# Patient Record
Sex: Male | Born: 1975 | Race: White | Hispanic: No | Marital: Married | Smoking: Never smoker
Health system: Southern US, Community
[De-identification: ages and names within clinical notes are randomized; demographics above are authoritative.]

## PROBLEM LIST (undated history)

## (undated) DIAGNOSIS — M5126 Other intervertebral disc displacement, lumbar region: Secondary | ICD-10-CM

## (undated) DIAGNOSIS — E7800 Pure hypercholesterolemia, unspecified: Secondary | ICD-10-CM

## (undated) HISTORY — DX: Pure hypercholesterolemia, unspecified: E78.00

---

## 2008-05-22 ENCOUNTER — Encounter: Admission: RE | Admit: 2008-05-22 | Discharge: 2008-05-22 | Payer: Self-pay | Admitting: Orthopedic Surgery

## 2017-04-29 ENCOUNTER — Emergency Department (HOSPITAL_BASED_OUTPATIENT_CLINIC_OR_DEPARTMENT_OTHER): Payer: BLUE CROSS/BLUE SHIELD

## 2017-04-29 ENCOUNTER — Encounter (HOSPITAL_BASED_OUTPATIENT_CLINIC_OR_DEPARTMENT_OTHER): Payer: Self-pay | Admitting: Emergency Medicine

## 2017-04-29 ENCOUNTER — Emergency Department (HOSPITAL_BASED_OUTPATIENT_CLINIC_OR_DEPARTMENT_OTHER)
Admission: EM | Admit: 2017-04-29 | Discharge: 2017-04-29 | Disposition: A | Discharge status: Home / Self Care | Payer: BLUE CROSS/BLUE SHIELD | Attending: Emergency Medicine | Admitting: Emergency Medicine

## 2017-04-29 ENCOUNTER — Other Ambulatory Visit: Payer: Self-pay

## 2017-04-29 DIAGNOSIS — N201 Calculus of ureter: Secondary | ICD-10-CM | POA: Diagnosis not present

## 2017-04-29 DIAGNOSIS — N200 Calculus of kidney: Secondary | ICD-10-CM

## 2017-04-29 DIAGNOSIS — N23 Unspecified renal colic: Secondary | ICD-10-CM

## 2017-04-29 DIAGNOSIS — R1031 Right lower quadrant pain: Secondary | ICD-10-CM | POA: Diagnosis present

## 2017-04-29 DIAGNOSIS — Z79899 Other long term (current) drug therapy: Secondary | ICD-10-CM | POA: Insufficient documentation

## 2017-04-29 HISTORY — DX: Other intervertebral disc displacement, lumbar region: M51.26

## 2017-04-29 LAB — COMPREHENSIVE METABOLIC PANEL
ALT: 18 U/L (ref 17–63)
ANION GAP: 9 (ref 5–15)
AST: 19 U/L (ref 15–41)
Albumin: 4.6 g/dL (ref 3.5–5.0)
Alkaline Phosphatase: 56 U/L (ref 38–126)
BUN: 24 mg/dL — AB (ref 6–20)
CHLORIDE: 101 mmol/L (ref 101–111)
CO2: 30 mmol/L (ref 22–32)
Calcium: 9.7 mg/dL (ref 8.9–10.3)
Creatinine, Ser: 1.04 mg/dL (ref 0.61–1.24)
Glucose, Bld: 126 mg/dL — ABNORMAL HIGH (ref 65–99)
POTASSIUM: 4.5 mmol/L (ref 3.5–5.1)
Sodium: 140 mmol/L (ref 135–145)
Total Bilirubin: 0.3 mg/dL (ref 0.3–1.2)
Total Protein: 7.7 g/dL (ref 6.5–8.1)

## 2017-04-29 LAB — LIPASE, BLOOD: LIPASE: 35 U/L (ref 11–51)

## 2017-04-29 LAB — URINALYSIS, ROUTINE W REFLEX MICROSCOPIC
Bilirubin Urine: NEGATIVE
GLUCOSE, UA: NEGATIVE mg/dL
Ketones, ur: NEGATIVE mg/dL
LEUKOCYTES UA: NEGATIVE
Nitrite: NEGATIVE
PH: 7 (ref 5.0–8.0)
Protein, ur: NEGATIVE mg/dL
Specific Gravity, Urine: 1.02 (ref 1.005–1.030)

## 2017-04-29 LAB — CBC
HEMATOCRIT: 45.4 % (ref 39.0–52.0)
HEMOGLOBIN: 15.9 g/dL (ref 13.0–17.0)
MCH: 32.2 pg (ref 26.0–34.0)
MCHC: 35 g/dL (ref 30.0–36.0)
MCV: 91.9 fL (ref 78.0–100.0)
Platelets: 301 10*3/uL (ref 150–400)
RBC: 4.94 MIL/uL (ref 4.22–5.81)
RDW: 12.8 % (ref 11.5–15.5)
WBC: 14.3 10*3/uL — AB (ref 4.0–10.5)

## 2017-04-29 LAB — URINALYSIS, MICROSCOPIC (REFLEX): SQUAMOUS EPITHELIAL / LPF: NONE SEEN

## 2017-04-29 MED ORDER — TAMSULOSIN HCL 0.4 MG PO CAPS: 0.4000 mg | ORAL_CAPSULE | Freq: Every day | ORAL | 0 refills | Status: AC

## 2017-04-29 MED ORDER — HYDROMORPHONE HCL 1 MG/ML IJ SOLN
0.5000 mg | Freq: Once | INTRAMUSCULAR | Status: AC
  Administered 2017-04-29: 0.5 mg via INTRAVENOUS
  Filled 2017-04-29: qty 1

## 2017-04-29 MED ORDER — KETOROLAC TROMETHAMINE 15 MG/ML IJ SOLN
INTRAMUSCULAR | Status: AC
  Filled 2017-04-29: qty 1

## 2017-04-29 MED ORDER — HYDROMORPHONE HCL 1 MG/ML IJ SOLN
1.0000 mg | Freq: Once | INTRAMUSCULAR | Status: AC
Start: 2017-04-29 — End: 2017-04-29
  Administered 2017-04-29: 1 mg via INTRAVENOUS
  Filled 2017-04-29: qty 1

## 2017-04-29 MED ORDER — TAMSULOSIN HCL 0.4 MG PO CAPS
0.4000 mg | ORAL_CAPSULE | Freq: Once | ORAL | Status: AC
  Administered 2017-04-29: 0.4 mg via ORAL
  Filled 2017-04-29: qty 1

## 2017-04-29 MED ORDER — ONDANSETRON HCL 4 MG/2ML IJ SOLN
4.0000 mg | Freq: Once | INTRAMUSCULAR | Status: AC
  Administered 2017-04-29: 4 mg via INTRAVENOUS
  Filled 2017-04-29: qty 2

## 2017-04-29 MED ORDER — ONDANSETRON HCL 4 MG/2ML IJ SOLN
4.0000 mg | Freq: Once | INTRAMUSCULAR | Status: AC | PRN
  Administered 2017-04-29: 4 mg via INTRAVENOUS
  Filled 2017-04-29: qty 2

## 2017-04-29 MED ORDER — IBUPROFEN 600 MG PO TABS: 600.0000 mg | ORAL_TABLET | Freq: Four times a day (QID) | ORAL | 0 refills | Status: DC | PRN

## 2017-04-29 MED ORDER — ONDANSETRON 4 MG PO TBDP: 4.0000 mg | ORAL_TABLET | Freq: Three times a day (TID) | ORAL | 0 refills | Status: AC | PRN

## 2017-04-29 MED ORDER — HYDROCODONE-ACETAMINOPHEN 5-325 MG PO TABS: 1.0000 | ORAL_TABLET | Freq: Three times a day (TID) | ORAL | 0 refills | Status: AC | PRN

## 2017-04-29 MED ORDER — KETOROLAC TROMETHAMINE 15 MG/ML IJ SOLN
10.0000 mg | Freq: Once | INTRAMUSCULAR | Status: AC
  Administered 2017-04-29: 10 mg via INTRAVENOUS

## 2017-04-29 MED ORDER — KETOROLAC TROMETHAMINE 15 MG/ML IJ SOLN
5.0000 mg | Freq: Once | INTRAMUSCULAR | Status: AC
  Administered 2017-04-29: 4.95 mg via INTRAVENOUS

## 2017-04-29 NOTE — ED Provider Notes (Signed)
[facility] EMERGENCY DEPARTMENT Provider Note  CSN: [id number] Arrival date & time: 04/29/17 1224  Chief Complaint(s) Abdominal Pain  HPI Jesus Frost is a 42 y.o. male with a history of herniated lumbar disc presents with sudden onset right flank pain that is constant but fluctuating in nature.  Described as deep and intense.  It radiates to the right groin.  Began suddenly 1 hour prior to arrival.  No alleviating or aggravating factors.  He is endorsing associated nausea and nonbloody nonbilious emesis.  He denies any fevers, chest pain, shortness of breath, dysuria, diarrhea.  No prior history of renal stones.  He does have a family history of kidney stones.  Currently denies any other associated physical complaints.  Denies any radicular pain on the right side.  No urinary/bowel incontinence.  No right lower extremity numbness or weakness.  HPI  Past Medical History Past Medical History:  Diagnosis Date  . Herniated lumbar intervertebral disc    There are no active problems to display for this patient.  Home Medication(s) Prior to Admission medications   Medication Sig Start Date End Date Taking? Authorizing Provider  meloxicam (MOBIC) 15 MG tablet Take 15 mg by mouth daily.   Yes [provider]  ibuprofen (ADVIL,MOTRIN) 600 MG tablet Take 1 tablet (600 mg total) by mouth every 6 (six) hours as needed. 04/29/17   Nira Connardama, Jasher Barkan Eduardo, MD  tamsulosin (FLOMAX) 0.4 MG CAPS capsule Take 1 capsule (0.4 mg total) by mouth daily for 7 days. 04/29/17 05/06/17  Nira Connardama, Tyri Elmore Eduardo, MD                                                                                                                                    Past Surgical History History reviewed. No pertinent surgical history. Family History No family history on file.  Social History Social History   Tobacco Use  . Smoking status: Never Smoker  . Smokeless tobacco: Never Used  Substance Use Topics  .  Alcohol use: Yes  . Drug use: Not on file   Allergies Patient has no known allergies.  Review of Systems Review of Systems All other systems are reviewed and are negative for acute change except as noted in the HPI  Physical Exam Vital Signs  I have reviewed the triage vital signs BP (!) 145/74 (BP Location: Left Arm)   Pulse (!) 56   Temp 98.1 F (36.7 C) (Oral)   Resp 18   SpO2 98%   Physical Exam  Constitutional: He is oriented to person, place, and time. He appears well-developed and well-nourished. No distress.  HENT:  Head: Normocephalic and atraumatic.  Right Ear: External ear normal.  Left Ear: External ear normal.  Nose: Nose normal.  Mouth/Throat: Mucous membranes are normal. No trismus in the jaw.  Eyes: Conjunctivae and EOM are normal. No scleral icterus.  Neck: Normal range of motion and phonation normal.  Cardiovascular:  Normal rate and regular rhythm.  Pulmonary/Chest: Effort normal. No stridor. No respiratory distress.  Abdominal: He exhibits no distension. There is no tenderness. There is CVA tenderness (mild right flank discomfort). No hernia.  Musculoskeletal: Normal range of motion. He exhibits no edema.  Neurological: He is alert and oriented to person, place, and time.  Skin: He is not diaphoretic.  Psychiatric: He has a normal mood and affect. His behavior is normal.  Vitals reviewed.   ED Results and Treatments Labs (all labs ordered are listed, but only abnormal results are displayed) Labs Reviewed  COMPREHENSIVE METABOLIC PANEL - Abnormal; Notable for the following components:      Result Value   Glucose, Bld 126 (*)    BUN 24 (*)    All other components within normal limits  CBC - Abnormal; Notable for the following components:   WBC 14.3 (*)    All other components within normal limits  URINALYSIS, ROUTINE W REFLEX MICROSCOPIC - Abnormal; Notable for the following components:   Hgb urine dipstick TRACE (*)    All other components  within normal limits  URINALYSIS, MICROSCOPIC (REFLEX) - Abnormal; Notable for the following components:   Bacteria, UA RARE (*)    All other components within normal limits  LIPASE, BLOOD                                                                                                                         EKG  EKG Interpretation  Date/Time:    Ventricular Rate:    PR Interval:    QRS Duration:   QT Interval:    QTC Calculation:   R Axis:     Text Interpretation:        Radiology Ct Renal Stone Study  Result Date: 04/29/2017 CLINICAL DATA:  42 year old with acute onset of right flank pain. EXAM: CT ABDOMEN AND PELVIS WITHOUT CONTRAST TECHNIQUE: Multidetector CT imaging of the abdomen and pelvis was performed following the standard protocol without IV contrast. COMPARISON:  MRI lumbar spine 04/08/2017 FINDINGS: Lower chest: Lung bases are clear. Hepatobiliary: Normal appearance of the liver and gallbladder. Pancreas: Limited evaluation of the pancreas on this noncontrast examination but no gross abnormality or duct dilatation. Spleen: Normal appearance of spleen without enlargement. Adrenals/Urinary Tract: Normal adrenal glands. 4 mm stone in left kidney upper pole without hydronephrosis. 3 mm stone at the right ureterovesical junction causing mild dilatation of the right ureter and right renal pelvis. Edema in the right renal hilum. Right kidney appears to be slightly edematous compared to the left kidney. Stomach/Bowel: Multiple diverticula involving the sigmoid colon and descending colon without acute inflammatory changes. Appendix is not confidently identified. Prominent diverticula involving the ascending colon. No evidence for bowel dilatation. Stomach is unremarkable. Vascular/Lymphatic: Atherosclerotic calcifications in the abdominal aorta without aneurysm. No significant lymph node enlargement in the abdomen or pelvis. Reproductive: Calcifications involving the prostate. Prostate is  prominent for size measuring 4.9 cm in transverse dimension. Other: No free  fluid.  No free air. Musculoskeletal: No acute bone abnormality. IMPRESSION: Obstructing 3 mm stone at the right ureterovesical junction. Mild right hydroureteronephrosis with right perinephric edema. Nonobstructive left kidney stone. Extensive colonic diverticulosis. Electronically Signed   By: Richarda Overlie M.D.   On: 04/29/2017 14:00   Pertinent labs & imaging results that were available during my care of the patient were reviewed by me and considered in my medical decision making (see chart for details).  Medications Ordered in ED Medications  ketorolac (TORADOL) 15 MG/ML injection (not administered)  tamsulosin (FLOMAX) capsule 0.4 mg (not administered)  ketorolac (TORADOL) 15 MG/ML injection 4.95 mg (not administered)  ondansetron (ZOFRAN) injection 4 mg (4 mg Intravenous Given 04/29/17 1242)  HYDROmorphone (DILAUDID) injection 0.5 mg (0.5 mg Intravenous Given 04/29/17 1255)  ketorolac (TORADOL) 15 MG/ML injection 10 mg (10 mg Intravenous Given 04/29/17 1314)                                                                                                                                    Procedures Procedures EMERGENCY DEPARTMENT US RENAL EXAM  "Study: Limited Retroperitoneal Ultrasound of Kidneys"  INDICATIONS: Flank pain Long and short axis of both kidneys were obtained.   PERFORMED BY: Myself IMAGES ARCHIVED?: Yes LIMITATIONS: Body habitus VIEWS USED: Long axis and Short axis  INTERPRETATION: Right Hydronephrosis mild, possible stone in right kydney  (including critical care time)  Medical Decision Making / ED Course I have reviewed the nursing notes for this encounter and the patient's prior records (if available in EHR or on provided paperwork).    Right flank pain.  Symptoms consistent with renal colic.  Low suspicion for acute cholecystitis, pancreatitis, appendicitis or other serious intra-abdominal  inflammatory/infectious process.  No symptoms to suggest cauda equina.  Bedside ultrasound with positive right hydroureter.  CT confirmed right UVJ obstructing stone.  Patient treated symptomatically with antiemetics and pain medicine.  Pain controlled in the emergency department.  Given first dose of Flomax in the ED.  UA without evidence of infection.  Recommend urology follow-up.  Final Clinical Impression(s) / ED Diagnoses Final diagnoses:  Ureterolithiasis  Renal colic on right side  Nephrolithiasis   Disposition: Discharge  Condition: Good  I have discussed the results, Dx and Tx plan with the patient who expressed understanding and agree(s) with the plan. Discharge instructions discussed at great length. The patient was given strict return precautions who verbalized understanding of the instructions. No further questions at time of discharge.    ED Discharge Orders        Ordered    tamsulosin (FLOMAX) 0.4 MG CAPS capsule  Daily     04/29/17 1427    ibuprofen (ADVIL,MOTRIN) 600 MG tablet  Every 6 hours PRN     04/29/17 1427       Follow Up: [facility] [address] [place] [address] [phone] Schedule an appointment  as soon as possible for a visit  For close follow up to assess for renal stones      This chart was dictated using voice recognition software.  Despite best efforts to proofread,  errors can occur which can change the documentation meaning.   Nira Conn, MD 04/29/17 [phone]

## 2017-04-29 NOTE — ED Triage Notes (Addendum)
RLQ pain radiating into testicles x 45 min with vomiting. Denies urinary symptoms

## 2017-04-29 NOTE — ED Notes (Signed)
Actively vomiting and moaning in pain. Will notify ED MD

## 2017-04-29 NOTE — ED Notes (Signed)
ED Provider at bedside. 

## 2017-04-29 NOTE — Discharge Instructions (Signed)
Discontinue meloxicam until you pass your stone. Use Motrin for pain control.

## 2017-04-29 NOTE — ED Notes (Signed)
Pt pacing floor in room. States his pain is returning. MD made aware.

## 2018-10-02 ENCOUNTER — Encounter (HOSPITAL_COMMUNITY): Payer: Self-pay

## 2018-10-02 ENCOUNTER — Emergency Department (HOSPITAL_COMMUNITY)
Admission: EM | Admit: 2018-10-02 | Discharge: 2018-10-02 | Disposition: A | Discharge status: Home / Self Care | Payer: HRSA Program | Attending: Emergency Medicine | Admitting: Emergency Medicine

## 2018-10-02 ENCOUNTER — Other Ambulatory Visit: Payer: Self-pay

## 2018-10-02 DIAGNOSIS — Z79899 Other long term (current) drug therapy: Secondary | ICD-10-CM | POA: Diagnosis not present

## 2018-10-02 DIAGNOSIS — Z20828 Contact with and (suspected) exposure to other viral communicable diseases: Secondary | ICD-10-CM | POA: Insufficient documentation

## 2018-10-02 DIAGNOSIS — Z20822 Contact with and (suspected) exposure to covid-19: Secondary | ICD-10-CM

## 2018-10-02 LAB — SARS CORONAVIRUS 2 BY RT PCR (HOSPITAL ORDER, PERFORMED IN ~~LOC~~ HOSPITAL LAB): SARS Coronavirus 2: NEGATIVE

## 2018-10-02 NOTE — ED Triage Notes (Signed)
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 He states he is not ill in any way.

## 2018-10-02 NOTE — ED Triage Notes (Signed)
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 He is requesting a test based upon the recommendation of [facility] here at [facility].

## 2018-10-02 NOTE — ED Provider Notes (Signed)
[facility] DEPT Provider Note   CSN: [id number] Arrival date & time: 10/02/18  1704    History   Chief Complaint Chief Complaint  Patient presents with  . Labs Only    HPI Arne Schlender is a 43 y.o. male.     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  The patient had a family friend over for dinner about 10 days ago and he tested positive for the virus today.  The patient just found this out as he was getting out of the OR.  He is scheduled to be on-call tomorrow and has a large number of patients to see in the office.  He called the employee health line and they recommended just continued observation.  He is concerned if he has the virus that he would potentially expose multiple people tomorrow and is requesting a rapid coronavirus test.  He states he had a fever blister over the past week and feels that he has some postnasal drip but otherwise denies shortness of breath cough fever vomiting diarrhea change in taste or smell.  The history is provided by the patient.  Illness Severity:  Moderate Onset quality:  Gradual Duration:  1 week Timing:  Constant Progression:  Unchanged Chronicity:  New Associated symptoms: no chest pain, no cough, no diarrhea, no nausea, no shortness of breath and no vomiting     Past Medical History:  Diagnosis Date  . Herniated lumbar intervertebral disc     There are no active problems to display for this patient.   No past surgical history on file.      Home Medications    Prior to Admission medications   Medication Sig Start Date End Date Taking? Authorizing Provider  ibuprofen (ADVIL,MOTRIN) 600 MG tablet Take 1 tablet (600 mg total) by mouth every 6 (six) hours as needed. 04/29/17   Fatima Blank, MD  meloxicam (MOBIC) 15 MG tablet Take 15 mg by mouth daily.    [provider]    Family History No  family history on file.  Social History Social History   Tobacco Use  . Smoking status: Never Smoker  . Smokeless tobacco: Never Used  Substance Use Topics  . Alcohol use: Yes  . Drug use: Not on file     Allergies   Patient has no known allergies.   Review of Systems Review of Systems  HENT: Positive for postnasal drip.   Respiratory: Negative for cough and shortness of breath.   Cardiovascular: Negative for chest pain.  Gastrointestinal: Negative for diarrhea, nausea and vomiting.  All other systems reviewed and are negative.    Physical Exam Updated Vital Signs BP (!) 141/81 (BP Location: Right Arm)   Pulse (!) 51   Temp 98.3 F (36.8 C) (Oral)   Resp 16   SpO2 100%   Physical Exam Vitals signs and nursing note reviewed.  Constitutional:      Appearance: He is well-developed.  HENT:     Head: Normocephalic and atraumatic.  Eyes:     Extraocular Movements: Extraocular movements intact.  Neck:     Musculoskeletal: Normal range of motion and neck supple.     Vascular: No JVD.  Pulmonary:     Effort: Pulmonary effort is normal. No respiratory distress.  Musculoskeletal: Normal range of motion.  Skin:    Coloration: Skin is not pale.     Findings:  No rash.  Neurological:     Mental Status: He is alert and oriented to person, place, and time.  Psychiatric:        Behavior: Behavior normal.      ED Treatments / Results  Labs (all labs ordered are listed, but only abnormal results are displayed) Labs Reviewed  SARS CORONAVIRUS 2 (HOSPITAL ORDER, [facility]FOR[facility])    EKG None  Radiology No results found.  Procedures Procedures (including critical care time)  Medications Ordered in ED Medications - No data to display   Initial Impression / Assessment and Plan / ED Course  I have reviewed the triage vital signs and the nursing notes.  Pertinent labs & imaging results that were available during my care of the patient  were reviewed by me and considered in my medical decision making (see chart for details).        43 yo M here for coronavirus test.  The patient unfortunately found out this afternoon that he had a close contact with someone who recently tested positive for the virus.  He is concerned because he has a full day in the office tomorrow and is also on-call for the hospital system.  I feel that it is reasonable to test him for the coronavirus.  We will send off a rapid test.  Discharge home.  5:24 PM:  I have discussed the diagnosis/risks/treatment options with the patient and believe the pt to be eligible for discharge home to follow-up with PCP. We also discussed returning to the ED immediately if new or worsening sx occur. We discussed the sx which are most concerning (e.g., sob) that necessitate immediate return. Medications administered to the patient during their visit and any new prescriptions provided to the patient are listed below.  Medications given during this visit Medications - No data to display   The patient appears reasonably screen and/or stabilized for discharge and I doubt any other medical condition or other [facility] requiring further screening, evaluation, or treatment in the ED at this time prior to discharge.    Final Clinical Impressions(s) / ED Diagnoses   Final diagnoses:  Close Exposure to Covid-19 Virus    ED Discharge Orders    None       Melene PlanFloyd, Lucah Petta, DO 10/02/18 1724

## 2018-10-02 NOTE — Discharge Instructions (Signed)
Follow up with your family doc.  Return to the ED for shortness of breath.

## 2020-06-12 ENCOUNTER — Other Ambulatory Visit (HOSPITAL_COMMUNITY): Payer: Self-pay | Admitting: *Deleted

## 2020-06-19 ENCOUNTER — Other Ambulatory Visit: Payer: Self-pay

## 2020-06-19 ENCOUNTER — Ambulatory Visit (HOSPITAL_BASED_OUTPATIENT_CLINIC_OR_DEPARTMENT_OTHER)
Admission: RE | Admit: 2020-06-19 | Discharge: 2020-06-19 | Disposition: A | Discharge status: Home / Self Care | Payer: Self-pay | Source: Ambulatory Visit | Attending: Cardiology | Admitting: Cardiology

## 2020-10-06 ENCOUNTER — Telehealth: Payer: Self-pay | Admitting: Cardiology

## 2020-10-06 DIAGNOSIS — E7800 Pure hypercholesterolemia, unspecified: Secondary | ICD-10-CM

## 2020-10-06 MED ORDER — ROSUVASTATIN CALCIUM 10 MG PO TABS: 10.0000 mg | ORAL_TABLET | Freq: Every day | ORAL | 3 refills | Status: DC

## 2020-10-06 NOTE — Telephone Encounter (Signed)
Left detailed voice mail with Dr. Anne Fu' advice to start rosuvastatin 10 mg with lab work 3 months later and an office visit. I have scheduled appointments and advised patient to call back or send MyChart message to reschedule. I have also requested patient's pharmacy of choice for Rx.

## 2020-10-06 NOTE — Telephone Encounter (Signed)
   Let's go ahead and start Crestor 10mg  PO QD In three months, let's check lipid panel and ALT and have you come in for new visit with me then to discuss.   I will have my team work on this and reach out to you.   Thanks,    LAD was 81 on cardiac CT score, accessible through epic.  Doctor's Day Labs 05/29/2019: Cholesterol, Total: 214 Trig 67 HDL 65 VLDL 12 LDL 137  Height 5'7, Wt 145 BMI 22.7 Vegetarian diet, although I eat a lot of dairy including eggs and I like my butter and fat Exercise 5-10 times per week, run, bike, high-intensity workouts  [facility] if you think I should start a statin.  Let me know if I should make a formal appointment, or whatever you think I should do.   Thanks,  JL

## 2020-10-06 NOTE — Telephone Encounter (Signed)
Spoke with patient to verify appointment dates/times and pharmacy of choice. He thanked me for the assistance.

## 2020-12-17 ENCOUNTER — Ambulatory Visit: Payer: Self-pay | Attending: Internal Medicine

## 2020-12-17 ENCOUNTER — Other Ambulatory Visit (HOSPITAL_BASED_OUTPATIENT_CLINIC_OR_DEPARTMENT_OTHER): Payer: Self-pay

## 2020-12-17 ENCOUNTER — Other Ambulatory Visit: Payer: Self-pay

## 2020-12-17 DIAGNOSIS — Z23 Encounter for immunization: Secondary | ICD-10-CM

## 2020-12-17 MED ORDER — PFIZER COVID-19 VAC BIVALENT 30 MCG/0.3ML IM SUSP
INTRAMUSCULAR | 0 refills | Status: DC
  Filled 2020-12-17: qty 0.3, 1d supply, fill #0

## 2020-12-17 NOTE — Progress Notes (Signed)
   Covid-19 Vaccination Clinic  Name:  Jesus Frost    MRN: [id number] DOB: 1976/02/25  12/17/2020  [name] was observed post Covid-19 immunization for 15 minutes without incident. He was provided with Vaccine Information Sheet and instruction to access the V-Safe system.   [name] was instructed to call 911 with any severe reactions post vaccine: Difficulty breathing  Swelling of face and throat  A fast heartbeat  A bad rash all over body  Dizziness and weakness

## 2021-01-08 ENCOUNTER — Other Ambulatory Visit: Payer: Self-pay | Admitting: *Deleted

## 2021-01-08 ENCOUNTER — Other Ambulatory Visit: Payer: Self-pay

## 2021-01-08 DIAGNOSIS — E7800 Pure hypercholesterolemia, unspecified: Secondary | ICD-10-CM

## 2021-01-09 LAB — LIPID PANEL
Chol/HDL Ratio: 2.7 ratio (ref 0.0–5.0)
Cholesterol, Total: 162 mg/dL (ref 100–199)
HDL: 60 mg/dL (ref >39)
LDL Chol Calc (NIH): 87 mg/dL (ref 0–99)
Triglycerides: 77 mg/dL (ref 0–149)
VLDL Cholesterol Cal: 15 mg/dL (ref 5–40)

## 2021-01-09 LAB — ALT: ALT: 27 IU/L (ref 0–44)

## 2021-01-13 NOTE — Progress Notes (Signed)
Cardiology Office Note:    Date:  01/15/2021   ID:  Jesus Frost, DOB November 15, 1975, MRN [id number]  PCP:  Patient, No Pcp Per (Inactive)   [facility] Providers Cardiologist:  Donato Schultz, MD     Referring MD: No ref. provider found   History of Present Illness:    Jesus Frost is a 45 y.o. male here for the evaluation of hypercholesteremia, elevated coronary calcium score LAD.  Cardiac CT score on 06/19/2020 showed LAD was 81, and 93rd percentile. Mr. Overbeck was contacted 10/06/2020 and instructed to start Crestor 10 mg.  Today, he is feeling fine overall with no current anginal symptoms.  Typically his exercise includes vigorous workouts.  Orthopedic surgeon.  He remains compliant with Crestor and has no indication of side effects.  He follows a vegetarian diet and consumes high amounts of fats including butter, whole milk, and other dairy products.  His grandfather likely died of CHF around 67 yo and had multiple strokes. His father is 23 yo, has some atherosclerotic disease, hyperlipidemia. His younger sister has hyperlipidemia.  He denies any palpitations, chest pain, or shortness of breath. No lightheadedness, headaches, syncope, orthopnea, PND, lower extremity edema or exertional symptoms.  Past Medical History:  Diagnosis Date   Herniated lumbar intervertebral disc    Hypercholesteremia     No past surgical history on file.  Current Medications: Current Meds  Medication Sig   rosuvastatin (CRESTOR) 10 MG tablet Take 1 tablet (10 mg total) by mouth daily.     Allergies:   Wasp venom and Yellow jacket venom   Social History   Socioeconomic History   Marital status: Married    Spouse name: Not on file   Number of children: Not on file   Years of education: Not on file   Highest education level: Not on file  Occupational History   Not on file  Tobacco Use   Smoking status: Never   Smokeless tobacco: Never  Substance and Sexual Activity   Alcohol use:  Yes   Drug use: Not on file   Sexual activity: Not on file  Other Topics Concern   Not on file  Social History Narrative   Not on file   Social Determinants of Health   Financial Resource Strain: Not on file  Food Insecurity: Not on file  Transportation Needs: Not on file  Physical Activity: Not on file  Stress: Not on file  Social Connections: Not on file     Family History: The patient's family history is not on file.  ROS:   Please see the history of present illness.    All other systems reviewed and are negative.  EKGs/Labs/Other Studies Reviewed:    The following studies were reviewed today:  Cardiac CT Calcium Score 06/19/2020: FINDINGS: Non-cardiac: See separate report from [facility].   Ascending Aorta: Normal caliber.   Pericardium: Normal.   Coronary arteries: Normal origins.   Coronary Calcium Score:   Left main: 0   Left anterior descending artery: 81   Left circumflex artery: 0   Right coronary artery: 0   Total: 81   Percentile: 93rd for age, sex, and race matched control.   IMPRESSION: 1. Coronary calcium score of 81. This was 93rd percentile for age, gender, and race matched controls.  Personally showed of the images.  Aortic atherosclerosis was also noted on prior abdominal CT.  EKG:  EKG is personally reviewed and interpreted. 01/15/2021: Sinus bradycardia. Rate 42 bpm.  Recent Labs: 01/08/2021: ALT  27   Recent Lipid Panel    Component Value Date/Time   CHOL 162 01/08/2021 0759   TRIG 77 01/08/2021 0759   HDL 60 01/08/2021 0759   CHOLHDL 2.7 01/08/2021 0759   LDLCALC 87 01/08/2021 0759     Risk Assessment/Calculations:          Physical Exam:    VS:  BP 106/70 (BP Location: Left Arm, Patient Position: Sitting, Cuff Size: Normal)   Pulse (!) 42   Ht 5\' 8"  (1.727 m)   Wt 149 lb (67.6 kg)   SpO2 95%   BMI 22.66 kg/m     Wt Readings from Last 3 Encounters:  01/15/21 149 lb (67.6 kg)  04/29/17 145 lb (65.8  kg)     GEN: Well nourished, well developed in no acute distress HEENT: Normal NECK: No JVD; No carotid bruits LYMPHATICS: No lymphadenopathy CARDIAC: RRR, no murmurs, rubs, gallops RESPIRATORY:  Clear to auscultation without rales, wheezing or rhonchi  ABDOMEN: Soft, non-tender, non-distended MUSCULOSKELETAL:  No edema; No deformity  SKIN: Warm and dry NEUROLOGIC:  Alert and oriented x 3 PSYCHIATRIC:  Normal affect   ASSESSMENT:    1. Hypercholesterolemia   2. Coronary artery calcification   3. Pure hypercholesterolemia    PLAN:    In order of problems listed above:  Coronary artery calcification Currently on Crestor 10 mg once a day.  Vegetarian diet.  He also eats dairy, whole milk, butter.  Continue with excellent exercise efforts.  Doing well.  No myalgias currently asymptomatic.  No anginal symptoms.  Doing well.  Pure hypercholesterolemia Continue with Crestor 10 mg once a day.  LDL currently 87.  He may try to alter some of the fat intake in his vegetarian diet.  We will be reviewing his lipid panel next year again.  If LDL still over 70, could consider increasing Crestor to 20 mg daily, high intensity dose to help reduce.  Discussed.     Follow-up: 12 months.  Medication Adjustments/Labs and Tests Ordered: Current medicines are reviewed at length with the patient today.  Concerns regarding medicines are outlined above.  Orders Placed This Encounter  Procedures   EKG 12-Lead     No orders of the defined types were placed in this encounter.   Patient Instructions  Medication Instructions:  The current medical regimen is effective;  continue present plan and medications.  *If you need a refill on your cardiac medications before your next appointment, please call your pharmacy*  Follow-Up: At [facility], you and your health needs are our priority.  As part of our continuing mission to provide you with exceptional heart care, we have created designated  Provider Care Teams.  These Care Teams include your primary Cardiologist (physician) and Advanced Practice Providers (APPs -  Physician Assistants and Nurse Practitioners) who all work together to provide you with the care you need, when you need it.  We recommend signing up for the patient portal called "MyChart".  Sign up information is provided on this After Visit Summary.  MyChart is used to connect with patients for Virtual Visits (Telemedicine).  Patients are able to view lab/test results, encounter notes, upcoming appointments, etc.  Non-urgent messages can be sent to your provider as well.   To learn more about what you can do with MyChart, go to [facility].    Your next appointment:   1 year(s)  The format for your next appointment:   In Person  Provider:   [email or web address], MD  Thank you for choosing [facility]!!     I,Mathew Stumpf,acting as a scribe for Donato Schultz, MD.,have documented all relevant documentation on the behalf of Donato Schultz, MD,as directed by  Donato Schultz, MD while in the presence of Donato Schultz, MD.  I, Donato Schultz, MD, have reviewed all documentation for this visit. The documentation on 01/15/21 for the exam, diagnosis, procedures, and orders are all accurate and complete.   Signed, Donato Schultz, MD  01/15/2021 9:25 AM    [facility]

## 2021-01-15 ENCOUNTER — Other Ambulatory Visit: Payer: Self-pay

## 2021-01-15 ENCOUNTER — Encounter: Payer: Self-pay | Admitting: Cardiology

## 2021-01-15 ENCOUNTER — Ambulatory Visit (INDEPENDENT_AMBULATORY_CARE_PROVIDER_SITE_OTHER): Payer: PRIVATE HEALTH INSURANCE | Admitting: Cardiology

## 2021-01-15 VITALS — BP 106/70 | HR 42 | Ht 68.0 in | Wt 149.0 lb

## 2021-01-15 DIAGNOSIS — I2584 Coronary atherosclerosis due to calcified coronary lesion: Secondary | ICD-10-CM

## 2021-01-15 DIAGNOSIS — I2510 Atherosclerotic heart disease of native coronary artery without angina pectoris: Secondary | ICD-10-CM

## 2021-01-15 DIAGNOSIS — E7800 Pure hypercholesterolemia, unspecified: Secondary | ICD-10-CM

## 2021-01-15 NOTE — Assessment & Plan Note (Addendum)
Currently on Crestor 10 mg once a day.  Vegetarian diet.  He also eats dairy, whole milk, butter.  Continue with excellent exercise efforts.  Doing well.  No myalgias currently asymptomatic.  No anginal symptoms.  Doing well.

## 2021-01-15 NOTE — Patient Instructions (Signed)
Medication Instructions:  The current medical regimen is effective;  continue present plan and medications.  *If you need a refill on your cardiac medications before your next appointment, please call your pharmacy*  Follow-Up: At [facility], you and your health needs are our priority.  As part of our continuing mission to provide you with exceptional heart care, we have created designated Provider Care Teams.  These Care Teams include your primary Cardiologist (physician) and Advanced Practice Providers (APPs -  Physician Assistants and Nurse Practitioners) who all work together to provide you with the care you need, when you need it.  We recommend signing up for the patient portal called "MyChart".  Sign up information is provided on this After Visit Summary.  MyChart is used to connect with patients for Virtual Visits (Telemedicine).  Patients are able to view lab/test results, encounter notes, upcoming appointments, etc.  Non-urgent messages can be sent to your provider as well.   To learn more about what you can do with MyChart, go to [email or web address].    Your next appointment:   1 year(s)  The format for your next appointment:   In Person  Provider:   Mark Skains, MD   Thank you for choosing [facility]!!    

## 2021-01-15 NOTE — Assessment & Plan Note (Signed)
Continue with Crestor 10 mg once a day.  LDL currently 87.  He may try to alter some of the fat intake in his vegetarian diet.  We will be reviewing his lipid panel next year again.  If LDL still over 70, could consider increasing Crestor to 20 mg daily, high intensity dose to help reduce.  Discussed.

## 2021-10-21 ENCOUNTER — Other Ambulatory Visit: Payer: Self-pay | Admitting: Cardiology

## 2022-04-20 ENCOUNTER — Other Ambulatory Visit: Payer: Self-pay | Admitting: Cardiology

## 2022-05-06 ENCOUNTER — Encounter: Payer: Self-pay | Admitting: Cardiology

## 2022-05-06 ENCOUNTER — Other Ambulatory Visit: Payer: Self-pay | Admitting: Cardiology

## 2022-05-06 MED ORDER — ROSUVASTATIN CALCIUM 10 MG PO TABS: 10.0000 mg | ORAL_TABLET | Freq: Every day | ORAL | 3 refills | Status: DC

## 2022-06-03 IMAGING — CT CT CARDIAC CORONARY ARTERY CALCIUM SCORE
2 series · 15 of 20 positions shown, 17 images · non-contrast
Comparison: None
COMPARISON: None

Addendum:
EXAM:
OVER-READ INTERPRETATION  CT CHEST

The following report is an over-read performed by radiologist Dr.
over-read does not include interpretation of cardiac or coronary
anatomy or pathology. The coronary calcium score interpretation by
the cardiologist is attached.
CLINICAL DATA: Risk stratification: 44 Year-old White Male
Coronary Calcium Score
TECHNIQUE: The patient was scanned on a Siemens Force scanner. Axial
non-contrast 3 mm slices were carried out through the heart. The
data set was analyzed on a dedicated work station and scored using
the Agatson method.

[Series 2: casc 3.0 i36f 2 bestdiast 67 % · axial · 0.39mm/px · z∈[-269,-161]mm · 8 of 48 slices shown, 10 images]
[im 6/48  vessel]
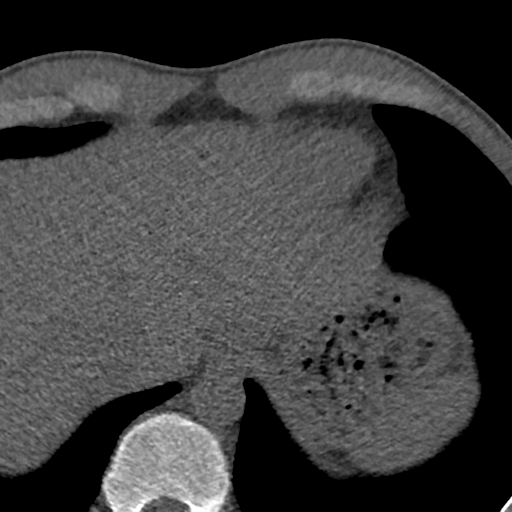
[im 6/48  lung]
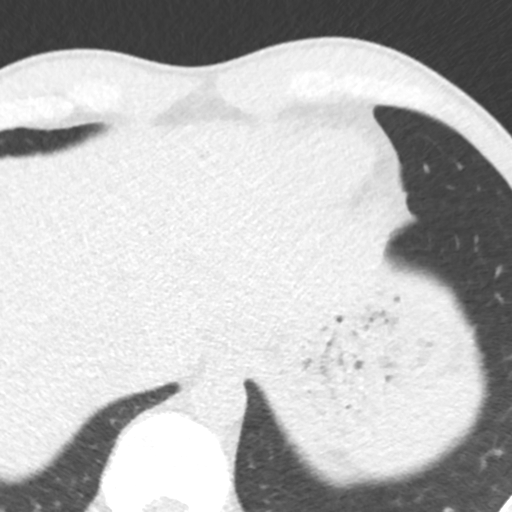
[im 11/48  vessel]
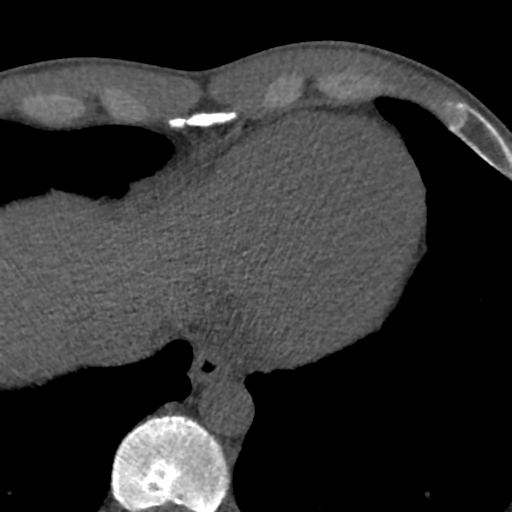
[im 16/48  vessel]
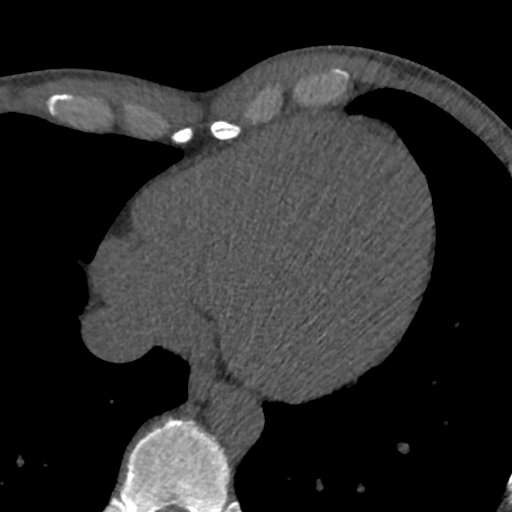
[im 21/48  vessel]
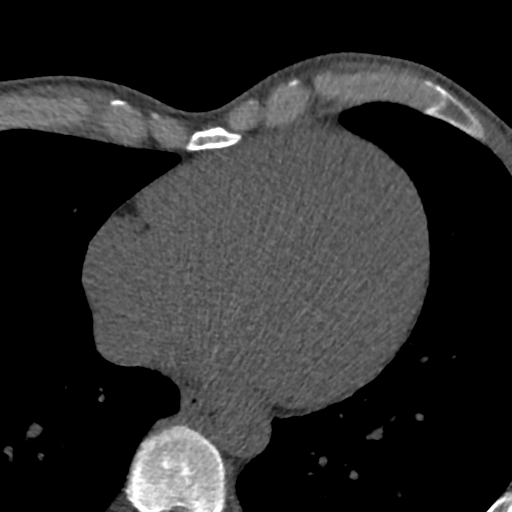
[im 27/48  vessel]
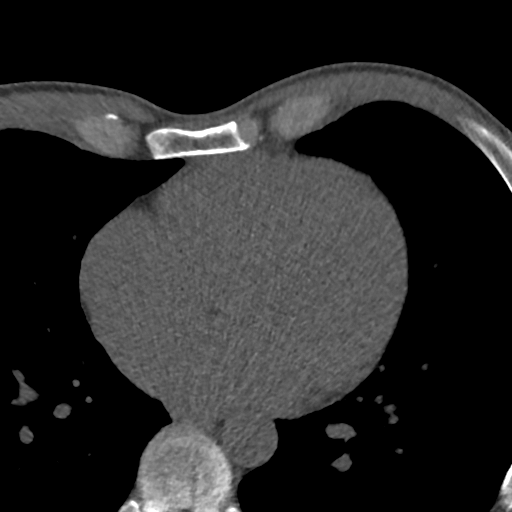
[im 27/48  lung]
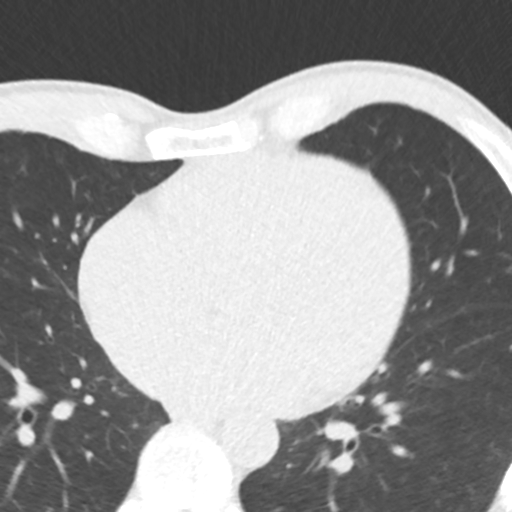
[im 32/48  vessel]
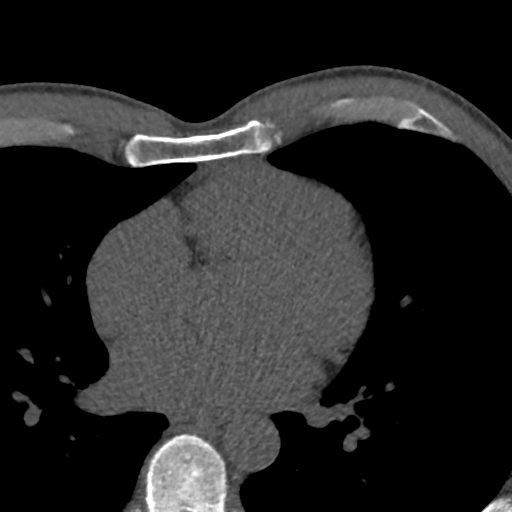
[im 37/48  vessel]
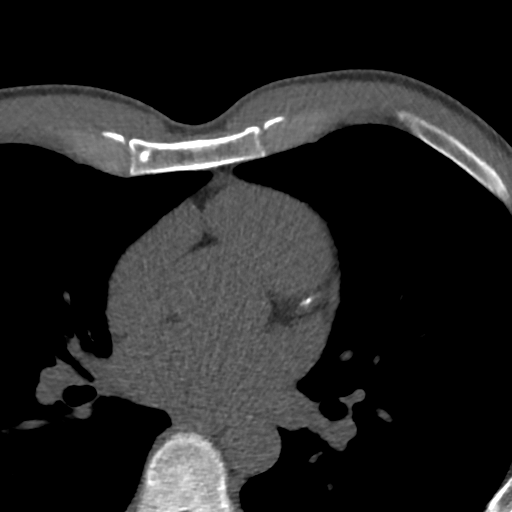
[im 42/48  vessel]
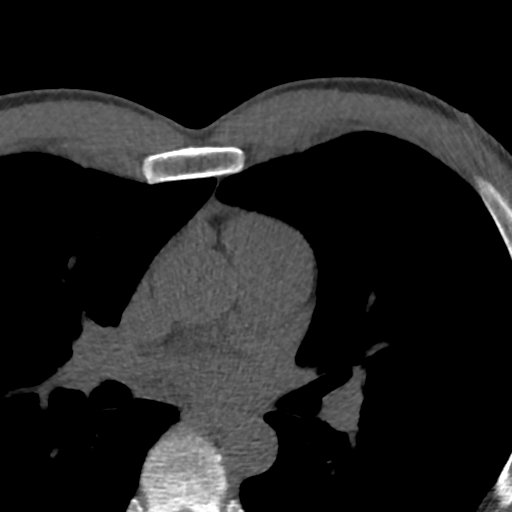

[Series 4: lung st 67 % · axial · 0.68mm/px · z∈[-269,-176]mm · 7 of 48 slices shown]
[im 6/48  lung]
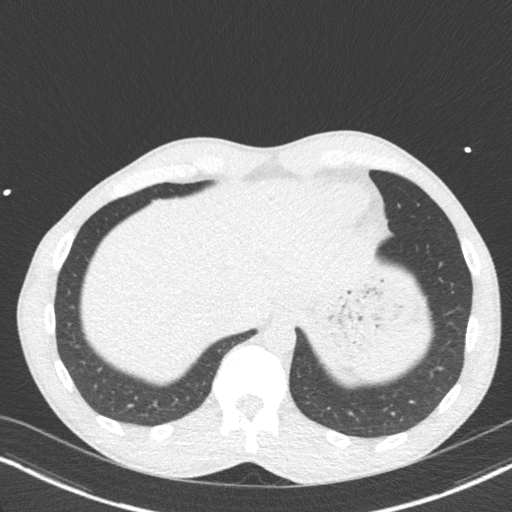
[im 11/48  lung]
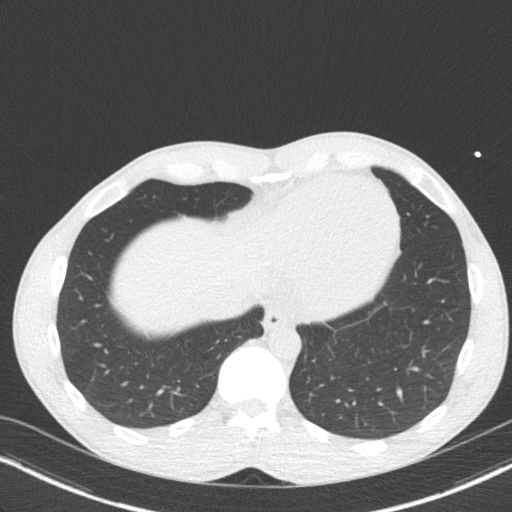
[im 16/48  lung]
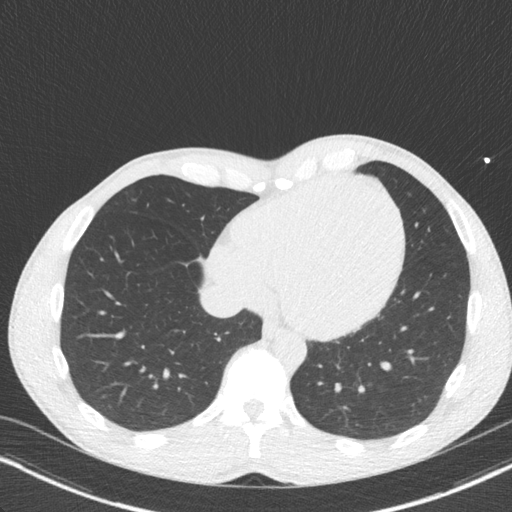
[im 21/48  lung]
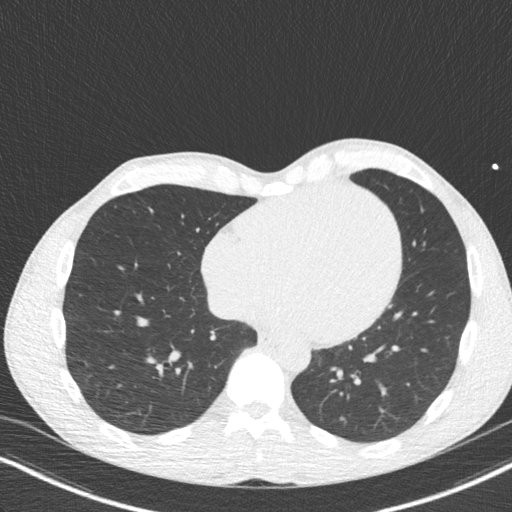
[im 27/48  lung]
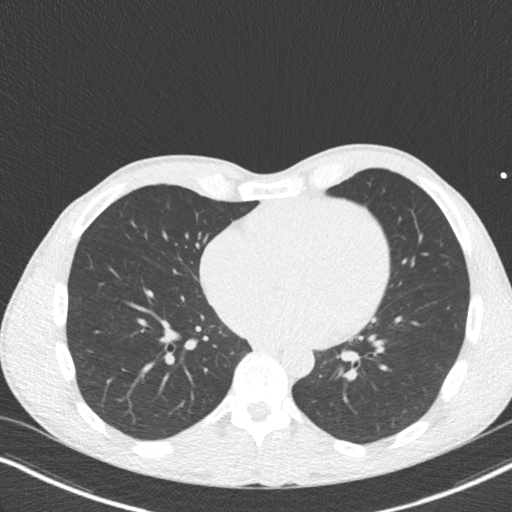
[im 32/48  lung]
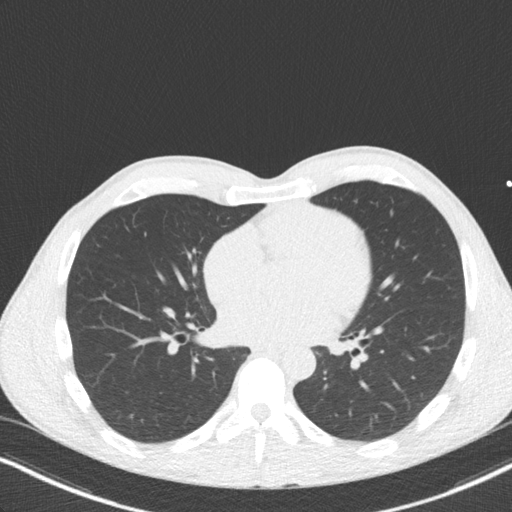
[im 37/48  lung]
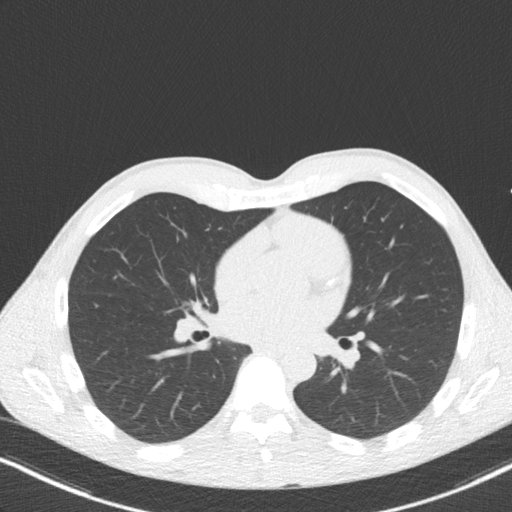

[15 of 20 positions shown; findings below may reference images not displayed]

FINDINGS: Vascular: No significant noncardiac vascular findings.

Mediastinum/Nodes: Visualized mediastinum and hilar regions
demonstrate no lymphadenopathy or masses.

Lungs/Pleura: Visualized lungs show no evidence of pulmonary edema,
consolidation, pneumothorax, nodule or pleural fluid.

Upper Abdomen: No acute abnormality.

Musculoskeletal: Mild pectus excavatum of the lower anterior chest
wall.
IMPRESSION: No significant noncardiac findings. Mild pectus excavatum of the
lower anterior chest wall.
FINDINGS: Non-cardiac: See separate report from [REDACTED].

Ascending Aorta: Normal caliber.

Pericardium: Normal.

Coronary arteries: Normal origins.

Coronary Calcium Score:

Left main: 0

Left anterior descending artery: 81

Left circumflex artery: 0

Right coronary artery: 0

Total: 81

Percentile: 93rd for age, sex, and race matched control.
IMPRESSION: 1. Coronary calcium score of 81. This was 93rd percentile for age,
gender, and race matched controls.

RECOMMENDATIONS:

Coronary artery calcium (CAC) score is a strong predictor of
incident coronary heart disease (CHD) and provides predictive
information beyond traditional risk factors. CAC scoring is
reasonable to use in the decision to withhold, postpone, or initiate
statin therapy in intermediate-risk or selected borderline-risk
asymptomatic adults (age 40-75 years and LDL-C ?70 to <190 mg/dL)
who do not have diabetes or established atherosclerotic
cardiovascular disease (ASCVD).* In intermediate-risk (10-year ASCVD
risk ?7.5% to <20%) adults or selected borderline-risk (10-year
ASCVD risk ?5% to <7.5%) adults in whom a CAC score is measured for
the purpose of making a treatment decision the following
recommendations have been made:

If CAC = 0, it is reasonable to withhold statin therapy and reassess
in 5 to 10 years, as long as higher risk conditions are absent
(diabetes mellitus, family history of premature CHD in first degree
relatives (males <55 years; females <65 years), cigarette smoking,
LDL ?190 mg/dL or other independent risk factors).

If CAC is 1 to 99, it is reasonable to initiate statin therapy for
patients ?55 years of age.

If CAC is ?100 or ?75th percentile, it is reasonable to initiate
statin therapy at any age.

Cardiology referral should be considered for patients with CAC
scores ?400 or ?75th percentile.

*8184 AHA/ACC/AACVPR/AAPA/ABC/TAT LEUNG/PAULUS N/HAZEL MARIE/[place]/JAYLON/DEL/DIOCLECIANO
Guideline on the Management of Blood Cholesterol: A Report of the
American College of Cardiology/American Heart Association Task Force
on Clinical Practice Guidelines. J Am Coll Cardiol.
0992;73(24):4439-4439.

*** End of Addendum ***
EXAM:
OVER-READ INTERPRETATION  CT CHEST

The following report is an over-read performed by radiologist Dr.
over-read does not include interpretation of cardiac or coronary
anatomy or pathology. The coronary calcium score interpretation by
the cardiologist is attached.
FINDINGS: Vascular: No significant noncardiac vascular findings.

Mediastinum/Nodes: Visualized mediastinum and hilar regions
demonstrate no lymphadenopathy or masses.

Lungs/Pleura: Visualized lungs show no evidence of pulmonary edema,
consolidation, pneumothorax, nodule or pleural fluid.

Upper Abdomen: No acute abnormality.

Musculoskeletal: Mild pectus excavatum of the lower anterior chest
wall.
IMPRESSION: No significant noncardiac findings. Mild pectus excavatum of the
lower anterior chest wall.

## 2023-05-04 ENCOUNTER — Other Ambulatory Visit: Payer: Self-pay | Admitting: Cardiology

## 2023-05-26 ENCOUNTER — Other Ambulatory Visit: Payer: Self-pay | Admitting: Cardiology

## 2023-06-28 ENCOUNTER — Other Ambulatory Visit: Payer: Self-pay | Admitting: Cardiology

## 2023-06-29 ENCOUNTER — Other Ambulatory Visit (HOSPITAL_COMMUNITY): Payer: Self-pay

## 2023-06-29 ENCOUNTER — Other Ambulatory Visit: Payer: Self-pay

## 2023-06-29 MED ORDER — ROSUVASTATIN CALCIUM 10 MG PO TABS
10.0000 mg | ORAL_TABLET | Freq: Every day | ORAL | 1 refills | Status: DC
  Filled 2023-06-29: qty 90, 90d supply, fill #0
  Filled 2023-09-25: qty 90, 90d supply, fill #1

## 2023-08-02 ENCOUNTER — Other Ambulatory Visit (HOSPITAL_BASED_OUTPATIENT_CLINIC_OR_DEPARTMENT_OTHER): Payer: Self-pay

## 2023-08-08 ENCOUNTER — Encounter: Payer: Self-pay | Admitting: Cardiology

## 2023-08-17 ENCOUNTER — Other Ambulatory Visit (HOSPITAL_BASED_OUTPATIENT_CLINIC_OR_DEPARTMENT_OTHER): Payer: Self-pay

## 2023-08-17 MED ORDER — MEFLOQUINE HCL 250 MG PO TABS
ORAL_TABLET | ORAL | 0 refills | Status: AC
  Filled 2023-08-17: qty 24, 60d supply, fill #0

## 2023-08-17 MED ORDER — DOXYCYCLINE HYCLATE 100 MG PO TABS
100.0000 mg | ORAL_TABLET | Freq: Two times a day (BID) | ORAL | 0 refills | Status: AC
  Filled 2023-08-17: qty 14, 7d supply, fill #0

## 2023-08-17 MED ORDER — ONDANSETRON 4 MG PO TBDP
4.0000 mg | ORAL_TABLET | Freq: Four times a day (QID) | ORAL | 0 refills | Status: AC | PRN
  Filled 2023-08-17: qty 15, 4d supply, fill #0

## 2023-08-17 MED ORDER — SCOPOLAMINE 1 MG/3DAYS TD PT72
1.0000 | MEDICATED_PATCH | TRANSDERMAL | 0 refills | Status: AC | PRN
  Filled 2023-08-17: qty 5, 15d supply, fill #0

## 2023-08-17 MED ORDER — CIPROFLOXACIN HCL 500 MG PO TABS
500.0000 mg | ORAL_TABLET | Freq: Two times a day (BID) | ORAL | 0 refills | Status: AC | PRN
  Filled 2023-08-17: qty 6, 3d supply, fill #0

## 2023-08-21 ENCOUNTER — Other Ambulatory Visit (HOSPITAL_BASED_OUTPATIENT_CLINIC_OR_DEPARTMENT_OTHER): Payer: Self-pay

## 2023-09-27 ENCOUNTER — Other Ambulatory Visit (HOSPITAL_COMMUNITY): Payer: Self-pay

## 2023-09-27 MED ORDER — FLUOROURACIL 5 % EX CREA
TOPICAL_CREAM | CUTANEOUS | 1 refills | Status: AC
  Filled 2023-09-27 – 2023-11-02 (×3): qty 40, 30d supply, fill #0

## 2023-09-28 ENCOUNTER — Other Ambulatory Visit (HOSPITAL_COMMUNITY): Payer: Self-pay

## 2023-09-29 ENCOUNTER — Other Ambulatory Visit (HOSPITAL_COMMUNITY): Payer: Self-pay

## 2023-09-30 ENCOUNTER — Other Ambulatory Visit (HOSPITAL_COMMUNITY): Payer: Self-pay

## 2023-10-02 ENCOUNTER — Other Ambulatory Visit (HOSPITAL_COMMUNITY): Payer: Self-pay

## 2023-10-03 ENCOUNTER — Other Ambulatory Visit (HOSPITAL_COMMUNITY): Payer: Self-pay

## 2023-10-10 ENCOUNTER — Other Ambulatory Visit (HOSPITAL_COMMUNITY): Payer: Self-pay

## 2023-10-11 ENCOUNTER — Other Ambulatory Visit (HOSPITAL_COMMUNITY): Payer: Self-pay

## 2023-10-12 ENCOUNTER — Other Ambulatory Visit (HOSPITAL_COMMUNITY): Payer: Self-pay

## 2023-10-18 ENCOUNTER — Other Ambulatory Visit (HOSPITAL_COMMUNITY): Payer: Self-pay

## 2023-10-20 ENCOUNTER — Other Ambulatory Visit (HOSPITAL_COMMUNITY): Payer: Self-pay

## 2023-10-23 ENCOUNTER — Other Ambulatory Visit (HOSPITAL_COMMUNITY): Payer: Self-pay

## 2023-10-24 ENCOUNTER — Other Ambulatory Visit (HOSPITAL_COMMUNITY): Payer: Self-pay

## 2023-11-02 ENCOUNTER — Other Ambulatory Visit: Payer: Self-pay

## 2023-11-02 ENCOUNTER — Other Ambulatory Visit (HOSPITAL_COMMUNITY): Payer: Self-pay

## 2023-12-19 ENCOUNTER — Other Ambulatory Visit: Payer: Self-pay | Admitting: Cardiology

## 2023-12-20 ENCOUNTER — Other Ambulatory Visit (HOSPITAL_COMMUNITY): Payer: Self-pay

## 2023-12-20 MED ORDER — ROSUVASTATIN CALCIUM 10 MG PO TABS
10.0000 mg | ORAL_TABLET | Freq: Every day | ORAL | 1 refills | Status: AC
  Filled 2023-12-20: qty 90, 90d supply, fill #0
  Filled 2024-03-20 – 2024-04-05 (×2): qty 90, 90d supply, fill #1

## 2024-03-20 ENCOUNTER — Other Ambulatory Visit: Payer: Self-pay

## 2024-04-05 ENCOUNTER — Other Ambulatory Visit (HOSPITAL_COMMUNITY): Payer: Self-pay

## 2024-04-05 ENCOUNTER — Other Ambulatory Visit: Payer: Self-pay

## 2024-04-09 ENCOUNTER — Other Ambulatory Visit (HOSPITAL_COMMUNITY): Payer: Self-pay
